# Patient Record
Sex: Female | Born: 1980 | Race: Black or African American | Hispanic: No
Health system: Southern US, Community
[De-identification: ages and names within clinical notes are randomized; demographics above are authoritative.]

## PROBLEM LIST (undated history)

## (undated) DIAGNOSIS — K5090 Crohn's disease, unspecified, without complications: Secondary | ICD-10-CM

## (undated) DIAGNOSIS — I471 Supraventricular tachycardia, unspecified: Secondary | ICD-10-CM

## (undated) DIAGNOSIS — R002 Palpitations: Secondary | ICD-10-CM

## (undated) HISTORY — PX: BOWEL RESECTION: SHX1257

## (undated) HISTORY — PX: APPENDECTOMY: SHX54

## (undated) HISTORY — DX: Supraventricular tachycardia, unspecified: I47.10

## (undated) HISTORY — PX: SMALL INTESTINE SURGERY: SHX150

## (undated) HISTORY — DX: Crohn's disease, unspecified, without complications: K50.90

## (undated) HISTORY — DX: Palpitations: R00.2

## (undated) HISTORY — DX: Supraventricular tachycardia: I47.1

---

## 2011-05-03 ENCOUNTER — Ambulatory Visit (INDEPENDENT_AMBULATORY_CARE_PROVIDER_SITE_OTHER): Payer: Self-pay | Admitting: Cardiology

## 2011-05-03 ENCOUNTER — Encounter: Payer: Self-pay | Admitting: Cardiology

## 2011-05-03 VITALS — BP 152/100 | HR 102 | Ht 65.0 in | Wt 187.0 lb

## 2011-05-03 DIAGNOSIS — I471 Supraventricular tachycardia: Secondary | ICD-10-CM

## 2011-05-03 DIAGNOSIS — R002 Palpitations: Secondary | ICD-10-CM

## 2011-05-03 DIAGNOSIS — I498 Other specified cardiac arrhythmias: Secondary | ICD-10-CM

## 2011-05-03 DIAGNOSIS — K5090 Crohn's disease, unspecified, without complications: Secondary | ICD-10-CM | POA: Insufficient documentation

## 2011-05-03 HISTORY — DX: Palpitations: R00.2

## 2011-05-03 NOTE — Assessment & Plan Note (Signed)
Status post ablation. No evidence of recurrence.

## 2011-05-03 NOTE — Assessment & Plan Note (Signed)
Patient is describing palpitations today and was having these in the office during her electrocardiogram. Her electrocardiogram shows sinus tachycardia which is most likely related to the Mucinex D that she has been taking for the past 3 days. Check TSH. Discontinue Mucinex d.

## 2011-05-03 NOTE — Patient Instructions (Signed)
Your physician recommends that you schedule a follow-up appointment in: 4 WEEKS  Your physician recommends that you return for lab work in: TODAY   

## 2011-05-03 NOTE — Progress Notes (Signed)
HPI: 30 year old female with past medical history of SVT for evaluation of palpitations. Patient has had 3 previous SVT ablations. Her most recent was at [facility] 2005. She has had no recurrences since. She denies dyspnea, chest pain or syncope. Over the last 3 days she has had URI symptoms. She was taking Mucinex D. Over the past 3 days. Last evening she began feeling her heart palpitations. Her heart rate was elevated but this was unlike her previous episodes of SVT. She was seen at urgent care and we were asked to further evaluate. Note she was having her symptoms at the time of her electrocardiogram today.  No current outpatient prescriptions on file.    Not on File  Past Medical History  Diagnosis Date  . SVT (supraventricular tachycardia)     status post 3 ablations  . Crohn's disease     Past Surgical History  Procedure Date  . Small intestine surgery   . Appendectomy     History   Social History  . Marital Status: Single    Spouse Name: N/A    Number of Children: 0  . Years of Education: N/A   Occupational History  .      registered nurse   Social History Main Topics  . Smoking status: Never Smoker   . Smokeless tobacco: Not on file  . Alcohol Use: No  . Drug Use: No  . Sexually Active: Not on file   Other Topics Concern  . Not on file   Social History Narrative  . No narrative on file    Family History  Problem Relation Age of Onset  . Hypertension      ROS: no fevers or chills, productive cough, hemoptysis, dysphasia, odynophagia, melena, hematochezia, dysuria, hematuria, rash, seizure activity, orthopnea, PND, pedal edema, claudication. Remaining systems are negative.  Physical Exam: General:  Well developed/well nourished in NAD Skin warm/dry Patient not depressed No peripheral clubbing Back-normal HEENT-normal/normal eyelids Neck supple/normal carotid upstroke bilaterally; no bruits; no JVD; no thyromegaly chest - CTA/ normal  expansion CV - RRR/normal S1 and S2; no murmurs, rubs or gallops;  PMI nondisplaced Abdomen -NT/ND, no HSM, no mass, + bowel sounds, no bruit 2+ femoral pulses, no bruits Ext-no edema, chords, 2+ DP Neuro-grossly nonfocal  ECG sinus tachycardia at a rate of 102. RV conduction delay. Minor nonspecific T wave changes.

## 2011-05-04 NOTE — Progress Notes (Signed)
Addended by: Kem Parkinson on: 05/04/2011 10:05 AM   Modules accepted: Orders

## 2011-05-31 ENCOUNTER — Encounter: Payer: Self-pay | Admitting: *Deleted

## 2011-06-01 ENCOUNTER — Encounter: Payer: Self-pay | Admitting: Cardiology

## 2011-06-01 ENCOUNTER — Ambulatory Visit (INDEPENDENT_AMBULATORY_CARE_PROVIDER_SITE_OTHER): Payer: Managed Care, Other (non HMO) | Admitting: Cardiology

## 2011-06-01 DIAGNOSIS — I498 Other specified cardiac arrhythmias: Secondary | ICD-10-CM

## 2011-06-01 DIAGNOSIS — I471 Supraventricular tachycardia: Secondary | ICD-10-CM

## 2011-06-01 DIAGNOSIS — R002 Palpitations: Secondary | ICD-10-CM

## 2011-06-01 NOTE — Assessment & Plan Note (Signed)
No recurrent bouts. Will consider adding a beta blocker in the future if her symptoms recur and possible referral to electrophysiology.

## 2011-06-01 NOTE — Progress Notes (Signed)
HPI:30 year old female with past medical history of SVT I saw in Oct of 2012 for evaluation of palpitations. Patient has had 3 previous SVT ablations. Her most recent was at [facility] 2005. She has had no recurrences since. She was having tachycardia when I saw her previously and ECG with symptoms revealed sinus tach. She was taking Mucinex D. This was discontinued. TSH normal. Since I saw her previously, she has had rare brief palpitations but no symptoms like she had on the day that I saw her or any symptoms similar to her previous bouts of SVT. No dyspnea, chest pain or syncope.   No current outpatient prescriptions on file.     Past Medical History  Diagnosis Date  . SVT (supraventricular tachycardia)     status post 3 ablations  . Crohn's disease   . Palpitations 05/03/2011    Past Surgical History  Procedure Date  . Small intestine surgery   . Appendectomy     History   Social History  . Marital Status: Single    Spouse Name: N/A    Number of Children: 0  . Years of Education: N/A   Occupational History  .      registered nurse   Social History Main Topics  . Smoking status: Never Smoker   . Smokeless tobacco: Not on file  . Alcohol Use: No  . Drug Use: No  . Sexually Active: Not on file   Other Topics Concern  . Not on file   Social History Narrative  . No narrative on file    ROS: no fevers or chills, productive cough, hemoptysis, dysphasia, odynophagia, melena, hematochezia, dysuria, hematuria, rash, seizure activity, orthopnea, PND, pedal edema, claudication. Remaining systems are negative.  Physical Exam: Well-developed well-nourished in no acute distress.  Skin is warm and dry.  HEENT is normal.  Neck is supple. No thyromegaly.  Chest is clear to auscultation with normal expansion.  Cardiovascular exam is regular rate and rhythm.  Abdominal exam nontender or distended. No masses palpated. Extremities show no edema. neuro grossly  intact

## 2011-06-01 NOTE — Patient Instructions (Signed)
Your physician recommends that you schedule a follow-up appointment in: AS NEEDED  

## 2011-06-01 NOTE — Assessment & Plan Note (Signed)
Resolved no further workup.

## 2011-07-19 ENCOUNTER — Ambulatory Visit (INDEPENDENT_AMBULATORY_CARE_PROVIDER_SITE_OTHER): Payer: BC Managed Care – PPO

## 2011-07-19 DIAGNOSIS — J069 Acute upper respiratory infection, unspecified: Secondary | ICD-10-CM

## 2011-09-14 ENCOUNTER — Ambulatory Visit (INDEPENDENT_AMBULATORY_CARE_PROVIDER_SITE_OTHER): Payer: BC Managed Care – PPO | Admitting: Internal Medicine

## 2011-09-14 VITALS — BP 123/85 | HR 102 | Temp 99.4°F | Resp 18 | Ht 66.0 in | Wt 202.0 lb

## 2011-09-14 DIAGNOSIS — J029 Acute pharyngitis, unspecified: Secondary | ICD-10-CM

## 2011-09-14 DIAGNOSIS — R509 Fever, unspecified: Secondary | ICD-10-CM

## 2011-09-14 MED ORDER — AZITHROMYCIN 250 MG PO TABS: ORAL_TABLET | ORAL | Status: AC

## 2011-09-14 NOTE — Patient Instructions (Signed)
Fever  Fever is a higher-than-normal body temperature. A normal temperature varies with:  Age.   How it is measured (mouth, underarm, rectal, or ear).   Time of day.  In an adult, an oral temperature around 98.6 Fahrenheit (F) or 37 Celsius (C) is considered normal. A rise in temperature of about 1.8 F or 1 C is generally considered a fever (100.4 F or 38 C). In an infant age 31 days or less, a rectal temperature of 100.4 F (38 C) generally is regarded as fever. Fever is not a disease but can be a symptom of illness. CAUSES   Fever is most commonly caused by infection.   Some non-infectious problems can cause fever. For example:   Some arthritis problems.   Problems with the thyroid or adrenal glands.   Immune system problems.   Some kinds of cancer.   A reaction to certain medicines.   Occasionally, the source of a fever cannot be determined. This is sometimes called a "Fever of Unknown Origin" (FUO).   Some situations may lead to a temporary rise in body temperature that may go away on its own. Examples are:   Childbirth.   Surgery.   Some situations may cause a rise in body temperature but these are not considered "true fever". Examples are:   Intense exercise.   Dehydration.   Exposure to high outside or room temperatures.  SYMPTOMS   Feeling warm or hot.   Fatigue or feeling exhausted.   Aching all over.   Chills.   Shivering.   Sweats.  DIAGNOSIS  A fever can be suspected by your caregiver feeling that your skin is unusually warm. The fever is confirmed by taking a temperature with a thermometer. Temperatures can be taken different ways. Some methods are accurate and some are not: With adults, adolescents, and children:   An oral temperature is used most commonly.   An ear thermometer will only be accurate if it is positioned as recommended by the manufacturer.   Under the arm temperatures are not accurate and not recommended.   Most  electronic thermometers are fast and accurate.  Infants and Toddlers:  Rectal temperatures are recommended and most accurate.   Ear temperatures are not accurate in this age group and are not recommended.   Skin thermometers are not accurate.  RISKS AND COMPLICATIONS   During a fever, the body uses more oxygen, so a person with a fever may develop rapid breathing or shortness of breath. This can be dangerous especially in people with heart or lung disease.   The sweats that occur following a fever can cause dehydration.   High fever can cause seizures in infants and children.   Older persons can develop confusion during a fever.  TREATMENT   Medications may be used to control temperature.   Do not give aspirin to children with fevers. There is an association with Reye's syndrome. Reye's syndrome is a rare but potentially deadly disease.   If an infection is present and medications have been prescribed, take them as directed. Finish the full course of medications until they are gone.   Sponging or bathing with room-temperature water may help reduce body temperature. Do not use ice water or alcohol sponge baths.   Do not over-bundle children in blankets or heavy clothes.   Drinking adequate fluids during an illness with fever is important to prevent dehydration.  HOME CARE INSTRUCTIONS   For adults, rest and adequate fluid intake are important. Dress according   to how you feel, but do not over-bundle.   Drink enough water and/or fluids to keep your urine clear or pale yellow.   For infants over 3 months and children, giving medication as directed by your caregiver to control fever can help with comfort. The amount to be given is based on the child's weight. Do NOT give more than is recommended.  SEEK MEDICAL CARE IF:   You or your child are unable to keep fluids down.   Vomiting or diarrhea develops.   You develop a skin rash.   An oral temperature above 102 F (38.9 C)  develops, or a fever which persists for over 3 days.   You develop excessive weakness, dizziness, fainting or extreme thirst.   Fevers keep coming back after 3 days.  SEEK IMMEDIATE MEDICAL CARE IF:   Shortness of breath or trouble breathing develops   You pass out.   You feel you are making little or no urine.   New pain develops that was not there before (such as in the head, neck, chest, back, or abdomen).   You cannot hold down fluids.   Vomiting and diarrhea persist for more than a day or two.   You develop a stiff neck and/or your eyes become sensitive to light.   An unexplained temperature above 102 F (38.9 C) develops.  Document Released: 07/10/2005 Document Revised: 03/22/2011 Document Reviewed: 06/25/2008 [facility] Patient Information 2012 [place], [place].Pharyngitis, Viral and Bacterial Pharyngitis is soreness (inflammation) or infection of the pharynx. It is also called a sore throat. CAUSES  Most sore throats are caused by viruses and are part of a cold. However, some sore throats are caused by strep and other bacteria. Sore throats can also be caused by post nasal drip from draining sinuses, allergies and sometimes from sleeping with an open mouth. Infectious sore throats can be spread from person to person by coughing, sneezing and sharing cups or eating utensils. TREATMENT  Sore throats that are viral usually last 3-4 days. Viral illness will get better without medications (antibiotics). Strep throat and other bacterial infections will usually begin to get better about 24-48 hours after you begin to take antibiotics. HOME CARE INSTRUCTIONS   If the caregiver feels there is a bacterial infection or if there is a positive strep test, they will prescribe an antibiotic. The full course of antibiotics must be taken. If the full course of antibiotic is not taken, you or your child may become ill again. If you or your child has strep throat and do not finish all of the  medication, serious heart or kidney diseases may develop.   Drink enough water and fluids to keep your urine clear or pale yellow.   Only take over-the-counter or prescription medicines for pain, discomfort or fever as directed by your caregiver.   Get lots of rest.   Gargle with salt water ( tsp. of salt in a glass of water) as often as every 1-2 hours as you need for comfort.   Hard candies may soothe the throat if individual is not at risk for choking. Throat sprays or lozenges may also be used.  SEEK MEDICAL CARE IF:   Large, tender lumps in the neck develop.   A rash develops.   Green, yellow-brown or bloody sputum is coughed up.   Your baby is older than 3 months with a rectal temperature of 100.5 F (38.1 C) or higher for more than 1 day.  SEEK IMMEDIATE MEDICAL CARE IF:  A stiff neck develops.   You or your child are drooling or unable to swallow liquids.   You or your child are vomiting, unable to keep medications or liquids down.   You or your child has severe pain, unrelieved with recommended medications.   You or your child are having difficulty breathing (not due to stuffy nose).   You or your child are unable to fully open your mouth.   You or your child develop redness, swelling, or severe pain anywhere on the neck.   You have a fever.   Your baby is older than 3 months with a rectal temperature of 102 F (38.9 C) or higher.   Your baby is 70 months old or younger with a rectal temperature of 100.4 F (38 C) or higher.  MAKE SURE YOU:   Understand these instructions.   Will watch your condition.   Will get help right away if you are not doing well or get worse.  Document Released: 07/10/2005 Document Revised: 03/22/2011 Document Reviewed: 10/07/2007 [facility] Patient Information 2012 [place], [place].

## 2011-09-14 NOTE — Progress Notes (Signed)
  Subjective:    Patient ID: Belinda Harris, female    DOB: 08/29/1980, 31 y.o.   MRN: [id number]  HPI Fever and sore throat. No chest sx.   Review of Systems  On BCP    Objective:   Physical Exam  Constitutional: She is oriented to person, place, and time. She appears well-nourished. No distress.  HENT:  Mouth/Throat: Oropharyngeal exudate present.  Eyes: EOM are normal.  Neck: Normal range of motion.  Cardiovascular: Normal rate.   Pulmonary/Chest: Effort normal.  Neurological: She is alert and oriented to person, place, and time.          Assessment & Plan:   RST neg  ZPAK and supportive care

## 2012-10-17 ENCOUNTER — Emergency Department (HOSPITAL_BASED_OUTPATIENT_CLINIC_OR_DEPARTMENT_OTHER): Payer: BC Managed Care – PPO

## 2012-10-17 ENCOUNTER — Emergency Department (HOSPITAL_BASED_OUTPATIENT_CLINIC_OR_DEPARTMENT_OTHER)
Admission: EM | Admit: 2012-10-17 | Discharge: 2012-10-17 | Disposition: A | Discharge status: Home / Self Care | Payer: BC Managed Care – PPO | Attending: Emergency Medicine | Admitting: Emergency Medicine

## 2012-10-17 ENCOUNTER — Encounter (HOSPITAL_BASED_OUTPATIENT_CLINIC_OR_DEPARTMENT_OTHER): Payer: Self-pay | Admitting: *Deleted

## 2012-10-17 DIAGNOSIS — Z8719 Personal history of other diseases of the digestive system: Secondary | ICD-10-CM | POA: Insufficient documentation

## 2012-10-17 DIAGNOSIS — K5289 Other specified noninfective gastroenteritis and colitis: Secondary | ICD-10-CM | POA: Insufficient documentation

## 2012-10-17 DIAGNOSIS — R109 Unspecified abdominal pain: Secondary | ICD-10-CM | POA: Insufficient documentation

## 2012-10-17 DIAGNOSIS — R197 Diarrhea, unspecified: Secondary | ICD-10-CM | POA: Insufficient documentation

## 2012-10-17 DIAGNOSIS — K529 Noninfective gastroenteritis and colitis, unspecified: Secondary | ICD-10-CM

## 2012-10-17 DIAGNOSIS — R5381 Other malaise: Secondary | ICD-10-CM | POA: Insufficient documentation

## 2012-10-17 DIAGNOSIS — R34 Anuria and oliguria: Secondary | ICD-10-CM | POA: Insufficient documentation

## 2012-10-17 DIAGNOSIS — Z3202 Encounter for pregnancy test, result negative: Secondary | ICD-10-CM | POA: Insufficient documentation

## 2012-10-17 DIAGNOSIS — Z9049 Acquired absence of other specified parts of digestive tract: Secondary | ICD-10-CM | POA: Insufficient documentation

## 2012-10-17 DIAGNOSIS — IMO0001 Reserved for inherently not codable concepts without codable children: Secondary | ICD-10-CM | POA: Insufficient documentation

## 2012-10-17 HISTORY — DX: Crohn's disease, unspecified, without complications: K50.90

## 2012-10-17 LAB — COMPREHENSIVE METABOLIC PANEL
ALT: 10 U/L (ref 0–35)
Albumin: 3.7 g/dL (ref 3.5–5.2)
Alkaline Phosphatase: 56 U/L (ref 39–117)
Calcium: 9 mg/dL (ref 8.4–10.5)
GFR calc Af Amer: >90 mL/min (ref >90)
Potassium: 3.5 mEq/L (ref 3.5–5.1)
Sodium: 139 mEq/L (ref 135–145)
Total Protein: 7.8 g/dL (ref 6.0–8.3)

## 2012-10-17 LAB — CBC WITH DIFFERENTIAL/PLATELET
Basophils Absolute: 0 10*3/uL (ref 0.0–0.1)
Basophils Relative: 0 % (ref 0–1)
Eosinophils Absolute: 0.1 10*3/uL (ref 0.0–0.7)
Eosinophils Relative: 2 % (ref 0–5)
MCH: 31.2 pg (ref 26.0–34.0)
MCHC: 34.8 g/dL (ref 30.0–36.0)
MCV: 89.7 fL (ref 78.0–100.0)
Neutrophils Relative %: 46 % (ref 43–77)
Platelets: 289 10*3/uL (ref 150–400)
RBC: 4.58 MIL/uL (ref 3.87–5.11)
RDW: 11.9 % (ref 11.5–15.5)

## 2012-10-17 LAB — URINE MICROSCOPIC-ADD ON

## 2012-10-17 LAB — PREGNANCY, URINE: Preg Test, Ur: NEGATIVE

## 2012-10-17 LAB — URINALYSIS, ROUTINE W REFLEX MICROSCOPIC
Glucose, UA: NEGATIVE mg/dL
Specific Gravity, Urine: 1.031 — ABNORMAL HIGH (ref 1.005–1.030)

## 2012-10-17 MED ORDER — SODIUM CHLORIDE 0.9 % IV BOLUS (SEPSIS)
1000.0000 mL | Freq: Once | INTRAVENOUS | Status: AC
  Administered 2012-10-17: 1000 mL via INTRAVENOUS

## 2012-10-17 MED ORDER — IOHEXOL 300 MG/ML  SOLN
100.0000 mL | Freq: Once | INTRAMUSCULAR | Status: AC | PRN
  Administered 2012-10-17: 100 mL via INTRAVENOUS

## 2012-10-17 MED ORDER — PREDNISONE (PAK) 10 MG PO TABS: 20.0000 mg | ORAL_TABLET | Freq: Every day | ORAL | Status: AC

## 2012-10-17 MED ORDER — ONDANSETRON 8 MG PO TBDP: ORAL_TABLET | ORAL | Status: AC

## 2012-10-17 MED ORDER — ONDANSETRON HCL 4 MG/2ML IJ SOLN
4.0000 mg | Freq: Once | INTRAMUSCULAR | Status: AC
  Administered 2012-10-17: 4 mg via INTRAVENOUS
  Filled 2012-10-17: qty 2

## 2012-10-17 MED ORDER — IOHEXOL 300 MG/ML  SOLN
50.0000 mL | Freq: Once | INTRAMUSCULAR | Status: AC | PRN
  Administered 2012-10-17: 50 mL via ORAL

## 2012-10-17 NOTE — ED Provider Notes (Signed)
History     CSN: [id number]  Arrival date & time 10/17/12  0501   First MD Initiated Contact with Patient 10/17/12 [phone]      Chief Complaint  Patient presents with   Emesis   Diarrhea    (Consider location/radiation/quality/duration/timing/severity/associated sxs/prior treatment) HPI Comments: Patient presents with a five-day history of nausea vomiting and diarrhea. She's describes the emesis is nonbilious and nonbloody. Her diarrhea as watery and nonbloody. She states she is feeling a little bit better yesterday he was able to eat some soup but then the diarrhea started back again in the vomiting started back again last night. She denies a known fevers. She has some pain in her left abdomen. She states it's fairly constant it is worse with diarrhea episodes. She does have a history of Crohn's disease however she hasn't had a flareup in about 10 years. She doesn't remember what her flareups were like in the past. She does remember that she used to be on medication for Crohn's and would intermittently take steroids but she has not been on medications for about 10 years.  Patient is a 32 y.o. female presenting with vomiting and diarrhea.  Emesis Associated symptoms: abdominal pain, diarrhea and myalgias   Associated symptoms: no arthralgias, no chills and no headaches   Diarrhea Associated symptoms: abdominal pain, myalgias and vomiting   Associated symptoms: no arthralgias, no chills, no diaphoresis, no fever and no headaches     Past Medical History  Diagnosis Date   Crohn's disease     Past Surgical History  Procedure Laterality Date   Bowel resection      History reviewed. No pertinent family history.  History  Substance Use Topics   Smoking status: Never Smoker    Smokeless tobacco: Not on file   Alcohol Use: No    OB History   Grav Para Term Preterm Abortions TAB SAB Ect Mult Living                  Review of Systems  Constitutional: Positive for  fatigue. Negative for fever, chills and diaphoresis.  HENT: Negative for congestion, rhinorrhea and sneezing.   Eyes: Negative.   Respiratory: Negative for cough, chest tightness and shortness of breath.   Cardiovascular: Negative for chest pain and leg swelling.  Gastrointestinal: Positive for nausea, vomiting, abdominal pain and diarrhea. Negative for blood in stool.  Genitourinary: Positive for decreased urine volume. Negative for frequency, hematuria, flank pain and difficulty urinating.  Musculoskeletal: Positive for myalgias. Negative for back pain and arthralgias.  Skin: Negative for rash.  Neurological: Negative for dizziness, speech difficulty, weakness, numbness and headaches.    Allergies  Review of patient's allergies indicates no known allergies.  Home Medications   Current Outpatient Rx  Name  Route  Sig  Dispense  Refill   etonogestrel-ethinyl estradiol (NUVARING) 0.12-0.015 MG/24HR vaginal ring   Vaginal   Place 1 each vaginally every 28 (twenty-eight) days. Insert vaginally and leave in place for 3 consecutive weeks, then remove for 1 week.          ondansetron (ZOFRAN ODT) 8 MG disintegrating tablet      8mg  ODT q4 hours prn nausea   12 tablet   0    predniSONE (STERAPRED UNI-PAK) 10 MG tablet   Oral   Take 2 tablets (20 mg total) by mouth daily. Take 3 tablets by mouth daily for 2 days, then 2 tablets by mouth daily for 2 days, then 1 tablet by mouth  for 2 days   12 tablet   0     BP 139/91   Pulse 88   Temp(Src) 99 F (37.2 C) (Oral)   Resp 20   Ht 5\' 5"  (1.651 m)   Wt 210 lb (95.255 kg)   BMI 34.95 kg/m2   SpO2 98%  Physical Exam  Constitutional: She is oriented to person, place, and time. She appears well-developed and well-nourished.  HENT:  Head: Normocephalic and atraumatic.  Eyes: Pupils are equal, round, and reactive to light.  Neck: Normal range of motion. Neck supple.  Cardiovascular: Normal rate, regular rhythm and normal heart sounds.    Pulmonary/Chest: Effort normal and breath sounds normal. No respiratory distress. She has no wheezes. She has no rales. She exhibits no tenderness.  Abdominal: Soft. Bowel sounds are normal. There is tenderness (mild tenderness to left mid and lower abdomen). There is no rebound and no guarding.  Musculoskeletal: Normal range of motion. She exhibits no edema.  Lymphadenopathy:    She has no cervical adenopathy.  Neurological: She is alert and oriented to person, place, and time.  Skin: Skin is warm and dry. No rash noted.  Psychiatric: She has a normal mood and affect.    ED Course  Procedures (including critical care time)  Results for orders placed during the hospital encounter of 10/17/12  URINALYSIS, ROUTINE W REFLEX MICROSCOPIC      Result Value Range   Color, Urine AMBER (*) YELLOW   APPearance CLOUDY (*) CLEAR   Specific Gravity, Urine 1.031 (*) 1.005 - 1.030   pH 6.0  5.0 - 8.0   Glucose, UA NEGATIVE  NEGATIVE mg/dL   Hgb urine dipstick LARGE (*) NEGATIVE   Bilirubin Urine SMALL (*) NEGATIVE   Ketones, ur 15 (*) NEGATIVE mg/dL   Protein, ur 30 (*) NEGATIVE mg/dL   Urobilinogen, UA 1.0  0.0 - 1.0 mg/dL   Nitrite NEGATIVE  NEGATIVE   Leukocytes, UA SMALL (*) NEGATIVE  PREGNANCY, URINE      Result Value Range   Preg Test, Ur NEGATIVE  NEGATIVE  CBC WITH DIFFERENTIAL      Result Value Range   WBC 5.7  4.0 - 10.5 K/uL   RBC 4.58  3.87 - 5.11 MIL/uL   Hemoglobin 14.3  12.0 - 15.0 g/dL   HCT 96.0  45.4 - 09.8 %   MCV 89.7  78.0 - 100.0 fL   MCH 31.2  26.0 - 34.0 pg   MCHC 34.8  30.0 - 36.0 g/dL   RDW 11.9  14.7 - 82.9 %   Platelets 289  150 - 400 K/uL   Neutrophils Relative 46  43 - 77 %   Neutro Abs 2.6  1.7 - 7.7 K/uL   Lymphocytes Relative 43  12 - 46 %   Lymphs Abs 2.5  0.7 - 4.0 K/uL   Monocytes Relative 9  3 - 12 %   Monocytes Absolute 0.5  0.1 - 1.0 K/uL   Eosinophils Relative 2  0 - 5 %   Eosinophils Absolute 0.1  0.0 - 0.7 K/uL   Basophils Relative 0  0 -  1 %   Basophils Absolute 0.0  0.0 - 0.1 K/uL  COMPREHENSIVE METABOLIC PANEL      Result Value Range   Sodium 139  135 - 145 mEq/L   Potassium 3.5  3.5 - 5.1 mEq/L   Chloride 101  96 - 112 mEq/L   CO2 27  19 - 32 mEq/L  Glucose, Bld 110 (*) 70 - 99 mg/dL   BUN 10  6 - 23 mg/dL   Creatinine, Ser 1.61  0.50 - 1.10 mg/dL   Calcium 9.0  8.4 - 09.6 mg/dL   Total Protein 7.8  6.0 - 8.3 g/dL   Albumin 3.7  3.5 - 5.2 g/dL   AST 16  0 - 37 U/L   ALT 10  0 - 35 U/L   Alkaline Phosphatase 56  39 - 117 U/L   Total Bilirubin 0.5  0.3 - 1.2 mg/dL   GFR calc non Af Amer >90  >90 mL/min   GFR calc Af Amer >90  >90 mL/min  URINE MICROSCOPIC-ADD ON      Result Value Range   Squamous Epithelial / LPF MANY (*) RARE   WBC, UA 7-10  <3 WBC/hpf   RBC / HPF 21-50  <3 RBC/hpf   Bacteria, UA MANY (*) RARE   Urine-Other MUCOUS PRESENT     Ct Abdomen Pelvis W Contrast  10/17/2012  *RADIOLOGY REPORT*  Clinical Data: Crohn's disease.  Abdominal pain.  Nausea. Vomiting.  History of prior bowel resection.  CT ABDOMEN AND PELVIS WITH CONTRAST  Technique:  Multidetector CT imaging of the abdomen and pelvis was performed following the standard protocol during bolus administration of intravenous contrast.  Contrast: 50mL OMNIPAQUE IOHEXOL 300 MG/ML  SOLN, OMNIPAQUE IOHEXOL 300 MG/ML  SOLN  Comparison: None.  Findings: Lung Bases: Pectus excavatum is present.  Dependent atelectasis in the lungs.  Visualized heart grossly normal. On sagittal reconstructed images, there is eventration of the anterior hemidiaphragms with fat from the abdomen extending into the inferior chest.  Liver:  Normal.  Spleen:  Normal.  Gallbladder:  Partially contracted.  No calcified stones.  Common bile duct:  Normal.  Pancreas:  Normal.  Adrenal glands:  Normal.  Kidneys:  Normal enhancement.  No calculi.  No hydronephrosis.  The ureters appear within normal limits.  Stomach:  Normal.  Small bowel:  No obstruction.  No inflammatory changes of  small bowel.  Normal opacification.  Small mesenteric lymph nodes are present.  Colon:   Surgical clips are present in the cecum compatible with prior appendectomy.  No appendix is identified.  There is no colonic mural inflammation or obstruction.  An anastomotic staple line is present in the sigmoid colon.  Pelvic Genitourinary:  Tampon is present in the vagina.  Anteverted uterus appears normal.  Physiologic appearance of the adnexa.  The urinary bladder normal.  No free fluid.  Bones:  No aggressive osseous lesions.  Sacroiliac joints appear within normal limits.  Vasculature: Normal.  IMPRESSION:  1.  No acute abnormality. 2.  Postoperative changes in the cecum and sigmoid colon.   Original Report Authenticated By: Andreas Newport, M.D.        1. Colitis       MDM  Patient with a five-day history of nausea vomiting and diarrhea. She has some crampy abdominal pain. She does have a history of Crohn's in her mother says that these are the type symptoms that she had with her typical Crohn's flareups when she was younger. Her CT was unremarkable. She's feeling better now with no active vomiting. She did not have an episode of diarrhea in ED to collect for analysis. I will go ahead and discharge her with prescriptions for Zofran and since her symptoms are similar to what her prior Crohn's flareups where local and treat her with a course of prednisone. I advised her  to followup with her primary care physician within the next 2 days for recheck or return here as needed for any worsening symptoms.        Rolan Bucco, MD 10/17/12 [phone]

## 2012-10-17 NOTE — ED Notes (Signed)
Patient transported to CT °

## 2012-10-17 NOTE — ED Notes (Signed)
C/o n/v/d since Sunday, denies fever

## 2012-10-17 NOTE — ED Notes (Signed)
MD at bedside. °

## 2012-10-18 LAB — URINE CULTURE

## 2013-05-29 ENCOUNTER — Other Ambulatory Visit: Payer: Self-pay

## 2018-02-06 ENCOUNTER — Ambulatory Visit: Payer: Self-pay | Admitting: Podiatry

## 2019-01-28 ENCOUNTER — Other Ambulatory Visit: Payer: Self-pay | Admitting: *Deleted

## 2019-01-28 DIAGNOSIS — Z20822 Contact with and (suspected) exposure to covid-19: Secondary | ICD-10-CM

## 2019-02-01 NOTE — Addendum Note (Signed)
Addended by: Brigitte Pulse on: 02/01/2019 10:04 AM   Modules accepted: Orders

## 2020-08-15 ENCOUNTER — Other Ambulatory Visit: Payer: Self-pay

## 2021-05-28 ENCOUNTER — Other Ambulatory Visit: Payer: Self-pay

## 2021-05-28 ENCOUNTER — Other Ambulatory Visit: Payer: Self-pay | Admitting: Family Medicine

## 2021-05-28 ENCOUNTER — Ambulatory Visit
Admission: RE | Admit: 2021-05-28 | Discharge: 2021-05-28 | Disposition: A | Discharge status: Home / Self Care | Payer: PRIVATE HEALTH INSURANCE | Source: Ambulatory Visit | Attending: Family Medicine | Admitting: Family Medicine

## 2021-05-28 ENCOUNTER — Ambulatory Visit
Admission: RE | Admit: 2021-05-28 | Discharge: 2021-05-28 | Disposition: A | Discharge status: Home / Self Care | Payer: Self-pay | Source: Ambulatory Visit | Attending: Family Medicine | Admitting: Family Medicine

## 2021-05-28 DIAGNOSIS — Z1231 Encounter for screening mammogram for malignant neoplasm of breast: Secondary | ICD-10-CM

## 2021-06-02 ENCOUNTER — Other Ambulatory Visit: Payer: Self-pay | Admitting: Obstetrics and Gynecology

## 2021-06-02 DIAGNOSIS — R928 Other abnormal and inconclusive findings on diagnostic imaging of breast: Secondary | ICD-10-CM

## 2021-06-28 ENCOUNTER — Ambulatory Visit
Admission: RE | Admit: 2021-06-28 | Discharge: 2021-06-28 | Disposition: A | Discharge status: Home / Self Care | Payer: PRIVATE HEALTH INSURANCE | Source: Ambulatory Visit | Attending: Obstetrics and Gynecology | Admitting: Obstetrics and Gynecology

## 2021-06-28 ENCOUNTER — Ambulatory Visit
Admission: RE | Admit: 2021-06-28 | Discharge: 2021-06-28 | Disposition: A | Discharge status: Home / Self Care | Payer: BC Managed Care – PPO | Source: Ambulatory Visit | Attending: Obstetrics and Gynecology | Admitting: Obstetrics and Gynecology

## 2021-06-28 ENCOUNTER — Other Ambulatory Visit: Payer: Self-pay | Admitting: Obstetrics and Gynecology

## 2021-06-28 ENCOUNTER — Other Ambulatory Visit: Payer: Self-pay

## 2021-06-28 DIAGNOSIS — R928 Other abnormal and inconclusive findings on diagnostic imaging of breast: Secondary | ICD-10-CM

## 2021-06-28 DIAGNOSIS — R921 Mammographic calcification found on diagnostic imaging of breast: Secondary | ICD-10-CM

## 2021-06-28 DIAGNOSIS — N6320 Unspecified lump in the left breast, unspecified quadrant: Secondary | ICD-10-CM

## 2021-12-28 ENCOUNTER — Ambulatory Visit: Payer: BC Managed Care – PPO

## 2021-12-28 ENCOUNTER — Ambulatory Visit
Admission: RE | Admit: 2021-12-28 | Discharge: 2021-12-28 | Disposition: A | Discharge status: Home / Self Care | Payer: BC Managed Care – PPO | Source: Ambulatory Visit | Attending: Obstetrics and Gynecology | Admitting: Obstetrics and Gynecology

## 2021-12-28 ENCOUNTER — Other Ambulatory Visit: Payer: Self-pay | Admitting: Obstetrics and Gynecology

## 2021-12-28 DIAGNOSIS — N6320 Unspecified lump in the left breast, unspecified quadrant: Secondary | ICD-10-CM

## 2021-12-28 DIAGNOSIS — R921 Mammographic calcification found on diagnostic imaging of breast: Secondary | ICD-10-CM

## 2022-05-30 ENCOUNTER — Ambulatory Visit
Admission: RE | Admit: 2022-05-30 | Discharge: 2022-05-30 | Disposition: A | Discharge status: Home / Self Care | Payer: BC Managed Care – PPO | Source: Ambulatory Visit | Attending: Obstetrics and Gynecology | Admitting: Obstetrics and Gynecology

## 2022-05-30 ENCOUNTER — Other Ambulatory Visit: Payer: Self-pay | Admitting: Obstetrics and Gynecology

## 2022-05-30 DIAGNOSIS — R921 Mammographic calcification found on diagnostic imaging of breast: Secondary | ICD-10-CM

## 2022-11-08 IMAGING — MG MM DIGITAL DIAGNOSTIC UNILAT*L* W/ TOMO W/ CAD
6 series · 6 of 14 positions shown · non-contrast
Comparison: Previous exam(s).

CLINICAL DATA: First six-month follow-up for probably benign LEFT
breast calcifications.

EXAM:
DIGITAL DIAGNOSTIC UNILATERAL LEFT MAMMOGRAM WITH TOMOSYNTHESIS AND
CAD
TECHNIQUE: Left digital diagnostic mammography and breast tomosynthesis was
performed. The images were evaluated with computer-aided detection.

[L ML]
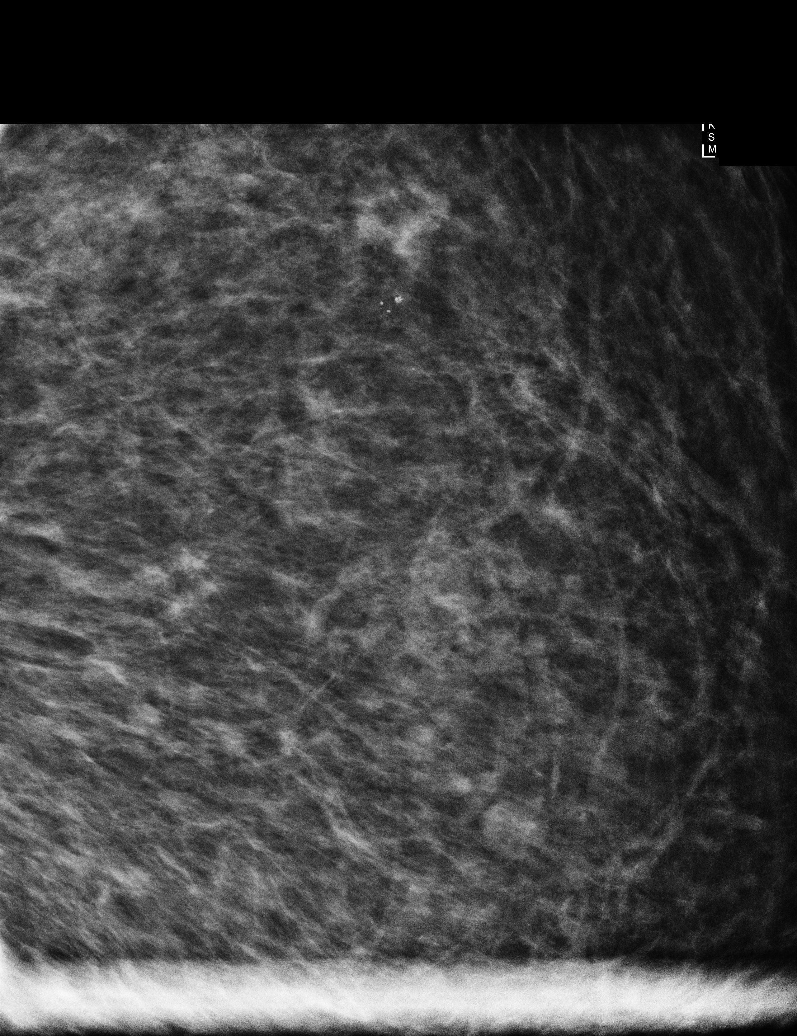

[L CC]
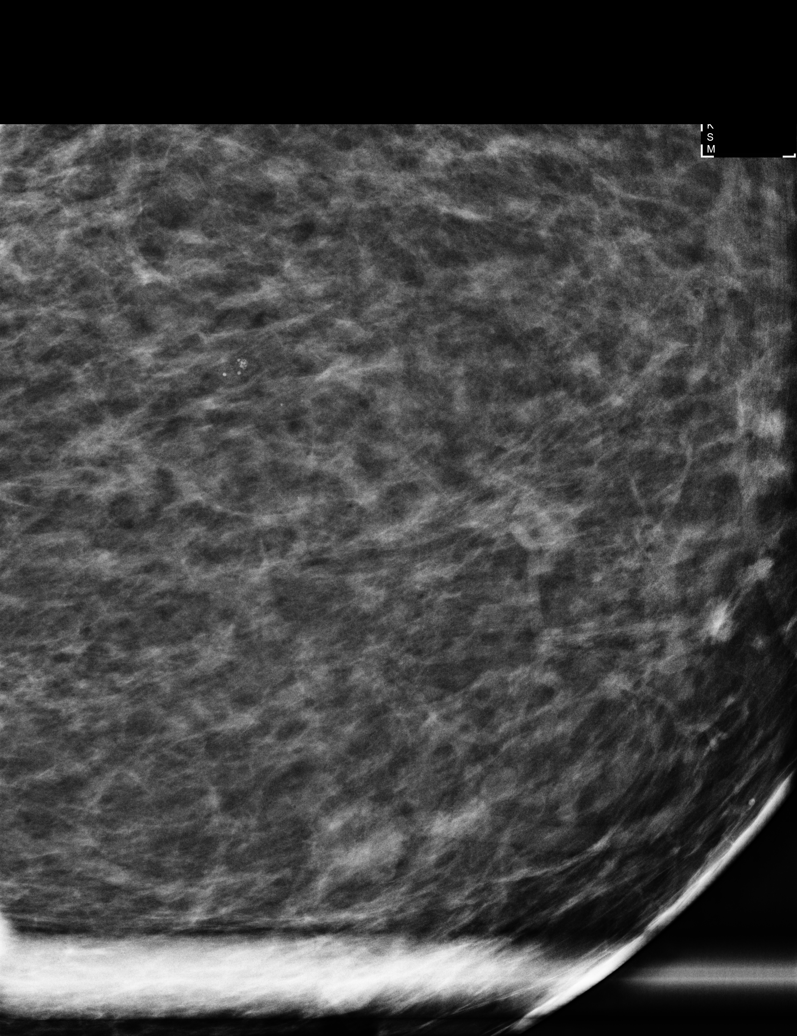

[L MLO synth-2D]
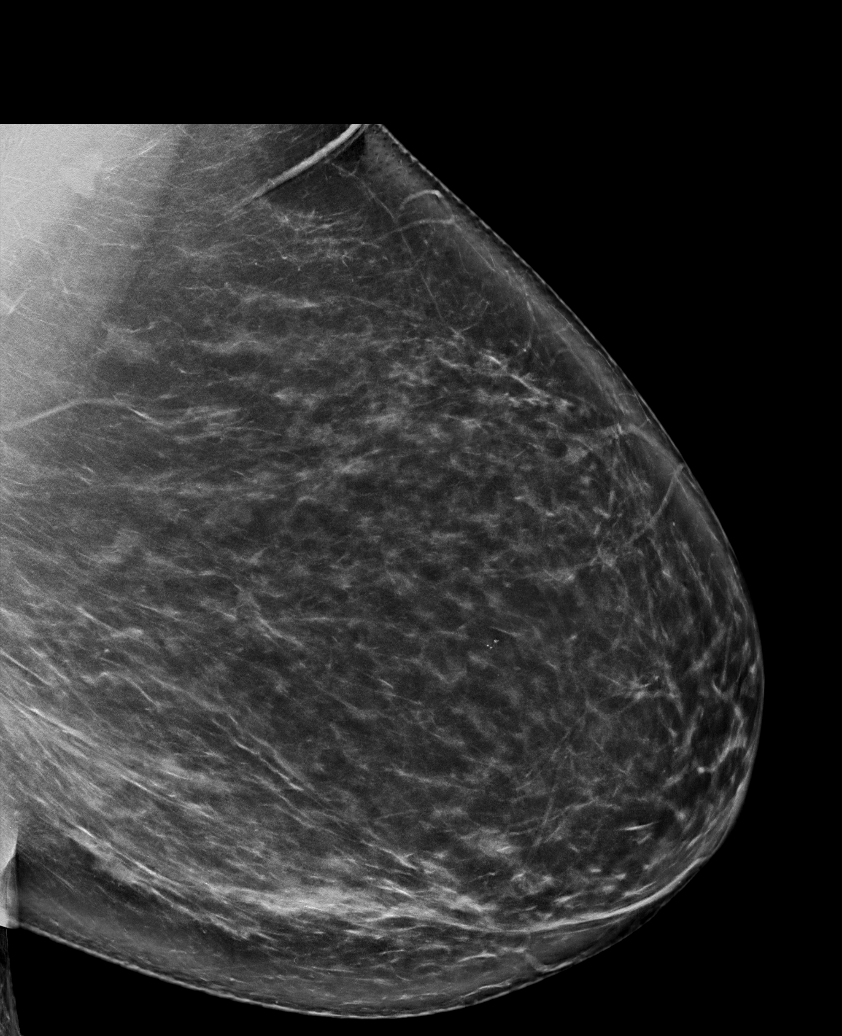

[L CC synth-2D]
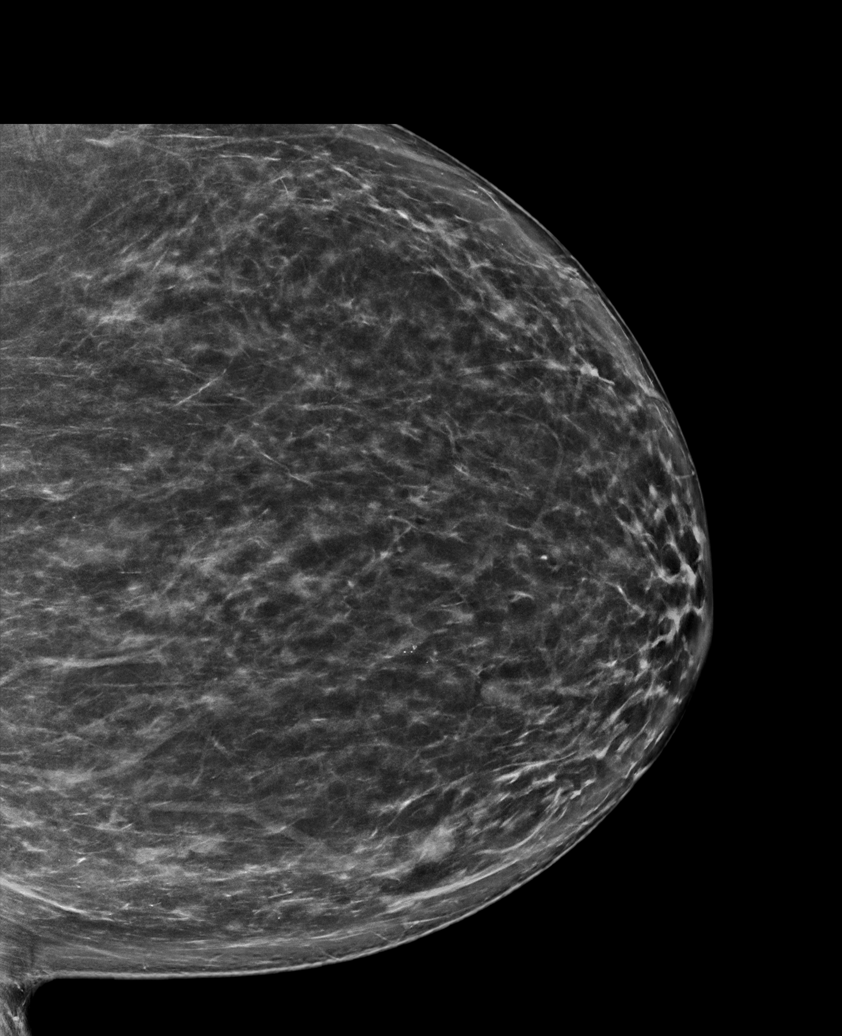

[L MLO tomo · tomo slice 50/99.0]
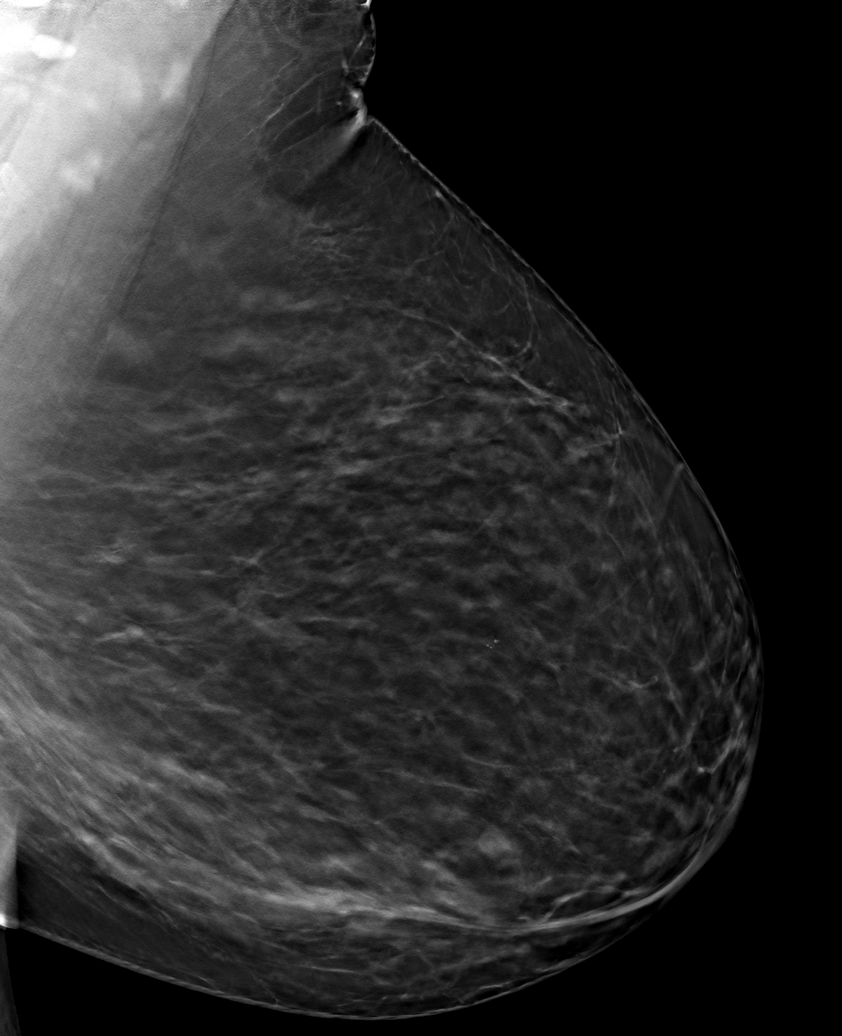

[L CC tomo · tomo slice 45/88.0]
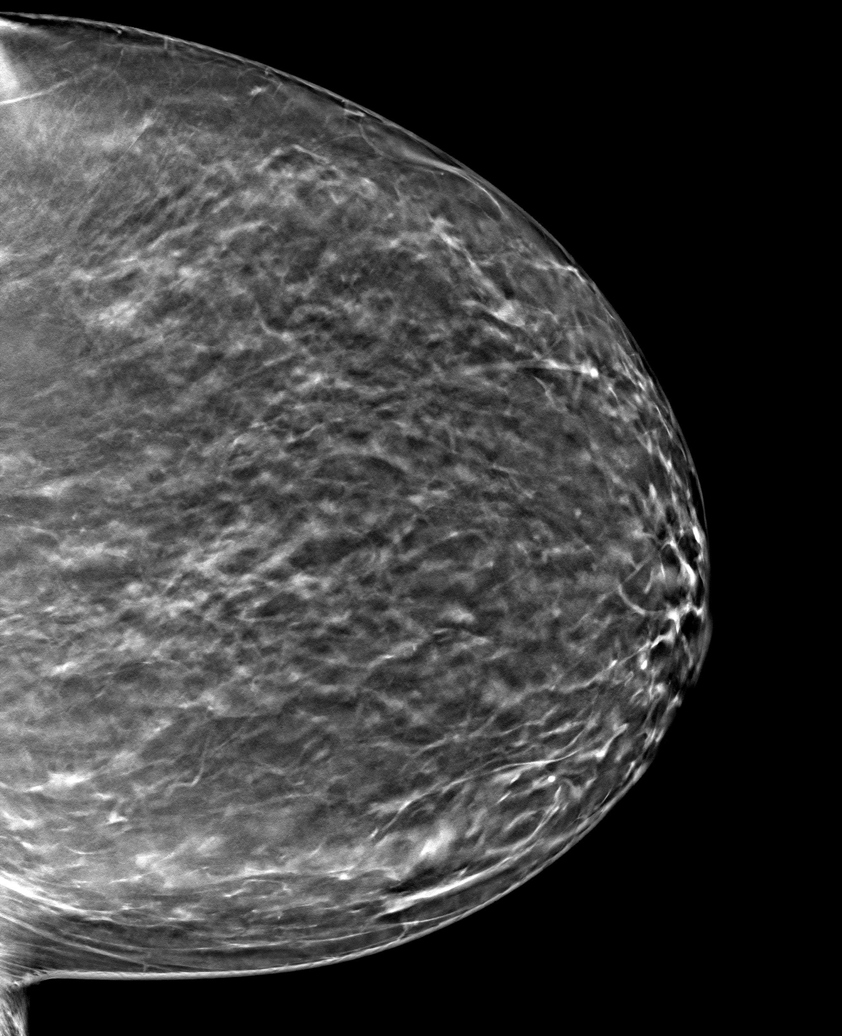

[6 of 14 positions shown; findings below may reference images not displayed]

ACR Breast Density Category c: The breast tissue is heterogeneously
dense, which may obscure small masses.
FINDINGS: Magnified views are performed of calcifications in the UPPER INNER
QUADRANT of the LEFT breast. Calcifications in this region are
round, some with lucent center. Calcifications have no suspicious
morphology or distribution. No new or suspicious findings in the
LEFT breast.
IMPRESSION: Stable, probably benign breast calcifications.

RECOMMENDATION:
Recommend bilateral diagnostic mammogram in 6 months.

I have discussed the findings and recommendations with the patient.
If applicable, a reminder letter will be sent to the patient
regarding the next appointment.

BI-RADS CATEGORY  3: Probably benign.

## 2023-05-29 ENCOUNTER — Other Ambulatory Visit: Payer: Self-pay | Admitting: Obstetrics and Gynecology

## 2023-05-29 DIAGNOSIS — R921 Mammographic calcification found on diagnostic imaging of breast: Secondary | ICD-10-CM

## 2023-05-29 DIAGNOSIS — N63 Unspecified lump in unspecified breast: Secondary | ICD-10-CM

## 2023-06-18 ENCOUNTER — Ambulatory Visit
Admission: RE | Admit: 2023-06-18 | Discharge: 2023-06-18 | Disposition: A | Discharge status: Home / Self Care | Payer: BC Managed Care – PPO | Source: Ambulatory Visit | Attending: Obstetrics and Gynecology | Admitting: Obstetrics and Gynecology

## 2023-06-18 ENCOUNTER — Other Ambulatory Visit: Payer: Self-pay | Admitting: Obstetrics and Gynecology

## 2023-06-18 DIAGNOSIS — N63 Unspecified lump in unspecified breast: Secondary | ICD-10-CM

## 2023-06-18 DIAGNOSIS — R921 Mammographic calcification found on diagnostic imaging of breast: Secondary | ICD-10-CM

## 2023-06-20 ENCOUNTER — Ambulatory Visit
Admission: RE | Admit: 2023-06-20 | Discharge: 2023-06-20 | Disposition: A | Discharge status: Home / Self Care | Payer: BC Managed Care – PPO | Source: Ambulatory Visit | Attending: Obstetrics and Gynecology | Admitting: Obstetrics and Gynecology

## 2023-06-20 ENCOUNTER — Inpatient Hospital Stay
Admission: RE | Admit: 2023-06-20 | Discharge: 2023-06-20 | Discharge status: Home / Self Care | Payer: BC Managed Care – PPO | Source: Ambulatory Visit | Attending: Obstetrics and Gynecology | Admitting: Obstetrics and Gynecology

## 2023-06-20 DIAGNOSIS — N63 Unspecified lump in unspecified breast: Secondary | ICD-10-CM

## 2023-06-20 HISTORY — PX: BREAST BIOPSY: SHX20

## 2023-06-22 LAB — SURGICAL PATHOLOGY

## 2024-01-06 ENCOUNTER — Emergency Department (HOSPITAL_BASED_OUTPATIENT_CLINIC_OR_DEPARTMENT_OTHER)

## 2024-01-06 ENCOUNTER — Emergency Department (HOSPITAL_BASED_OUTPATIENT_CLINIC_OR_DEPARTMENT_OTHER)
Admission: EM | Admit: 2024-01-06 | Discharge: 2024-01-06 | Disposition: A | Discharge status: Home / Self Care | Attending: Emergency Medicine | Admitting: Emergency Medicine

## 2024-01-06 ENCOUNTER — Encounter (HOSPITAL_BASED_OUTPATIENT_CLINIC_OR_DEPARTMENT_OTHER): Payer: Self-pay | Admitting: Emergency Medicine

## 2024-01-06 ENCOUNTER — Other Ambulatory Visit: Payer: Self-pay

## 2024-01-06 DIAGNOSIS — M79672 Pain in left foot: Secondary | ICD-10-CM | POA: Insufficient documentation

## 2024-01-06 DIAGNOSIS — W208XXA Other cause of strike by thrown, projected or falling object, initial encounter: Secondary | ICD-10-CM | POA: Insufficient documentation

## 2024-01-06 MED ORDER — TETANUS-DIPHTH-ACELL PERTUSSIS 5-2.5-18.5 LF-MCG/0.5 IM SUSY: 0.5000 mL | PREFILLED_SYRINGE | Freq: Once | INTRAMUSCULAR | Status: DC

## 2024-01-06 NOTE — Discharge Instructions (Signed)
 You were seen in the emergency department today for concerns of foot pain.  Your x-ray was negative for any signs of fracture, dislocation, or other injury.  I suspect you likely have pain from the umbrella that struck against your foot.  Please use Tylenol and ibuprofen for pain.  You may use ice to help reduce swelling for the next 24 hours.  For any concerns of new or worsening symptoms return to the emergency department.

## 2024-01-06 NOTE — ED Provider Notes (Signed)
 [place] EMERGENCY DEPARTMENT AT [facility] [place] Provider Note   CSN: [id number] Arrival date & time: 01/06/24  4098     Patient presents with: Foot Pain   Belinda Harris is a 43 y.o. female.  Patient with past history significant for SVT and Crohn's disease presents the emergency department concerns of foot pain.  She reports that she had a patio umbrella fall on her left foot.  Noticing pain to the left big toe.  Denies any other injuries.  Tolerating weightbearing.  No medications taken prior to arriving.   Foot Pain       Prior to Admission medications   Medication Sig Start Date End Date Taking? Authorizing Provider  etonogestrel-ethinyl estradiol (NUVARING) 0.12-0.015 MG/24HR vaginal ring Place 1 each vaginally every 28 (twenty-eight) days. Insert vaginally and leave in place for 3 consecutive weeks, then remove for 1 week.    [provider]  ondansetron  (ZOFRAN  ODT) 8 MG disintegrating tablet 8mg  ODT q4 hours prn nausea 10/17/12   Hershel Los, MD  predniSONE  (STERAPRED UNI-PAK) 10 MG tablet Take 2 tablets (20 mg total) by mouth daily. Take 3 tablets by mouth daily for 2 days, then 2 tablets by mouth daily for 2 days, then 1 tablet by mouth for 2 days 10/17/12   Hershel Los, MD    Allergies: Patient has no known allergies.    Review of Systems  Musculoskeletal:        Foot pain  All other systems reviewed and are negative.   Updated Vital Signs BP (!) 146/84 (BP Location: Left Arm)   Pulse 75   Temp 98.9 F (37.2 C) (Oral)   Resp 15   Ht 5' 5 (1.651 m)   Wt 77.1 kg   LMP 01/03/2024   SpO2 100%   BMI 28.29 kg/m   Physical Exam Vitals and nursing note reviewed.  Constitutional:      General: She is not in acute distress.    Appearance: She is well-developed.  HENT:     Head: Normocephalic and atraumatic.   Eyes:     Conjunctiva/sclera: Conjunctivae normal.    Cardiovascular:     Rate and Rhythm: Normal rate and regular  rhythm.     Heart sounds: No murmur heard. Pulmonary:     Effort: Pulmonary effort is normal. No respiratory distress.     Breath sounds: Normal breath sounds.  Abdominal:     Palpations: Abdomen is soft.     Tenderness: There is no abdominal tenderness.   Musculoskeletal:        General: Tenderness present. No swelling, deformity or signs of injury. Normal range of motion.     Cervical back: Neck supple.     Comments: TTP overlying the great toe of the left foot. No bruising, nail injury, or lacerations seen.   Skin:    General: Skin is warm and dry.     Capillary Refill: Capillary refill takes less than 2 seconds.   Neurological:     Mental Status: She is alert.   Psychiatric:        Mood and Affect: Mood normal.     (all labs ordered are listed, but only abnormal results are displayed) Labs Reviewed - No data to display  EKG: None  Radiology: DG Foot Complete Left Result Date: 01/06/2024 CLINICAL DATA:  Status post trauma to the left great toe. EXAM: LEFT FOOT - COMPLETE 3+ VIEW COMPARISON:  None Available. FINDINGS: There is no evidence of fracture or  dislocation. There is no evidence of arthropathy or other focal bone abnormality. Soft tissues are unremarkable. IMPRESSION: Negative. Electronically Signed   By: Virgle Grime M.D.   On: 01/06/2024 20:41     Procedures   Medications Ordered in the ED - No data to display                                  Medical Decision Making Amount and/or Complexity of Data Reviewed Radiology: ordered.   This patient presents to the ED for concern of foot pain.  Differential diagnosis includes midfoot fracture, nail avulsion, cellulitis, ankle sprain   Imaging Studies ordered:  I ordered imaging studies including x-ray of the left foot I independently visualized and interpreted imaging which showed negative for any acute injuries I agree with the radiologist interpretation   Problem List / ED Course:  Patient  presents the emergency department concerns of foot pain.  She reports that she had a umbrella fall onto her left foot at the great toe resulting in pain.  States she can ambulate but has some pain with ambulation.  Denies any altered sensation. On exam, patient does have point tenderness overlying the left great toe.  No nail avulsion present.  No significant erythema or swelling present.  Range of motion at baseline.  X-ray imaging ordered from triage for assessment of possible fracture or other injury.  No appreciable lacerations requiring repair at this time. X-ray imaging is negative for any acute findings.  Suspect this is likely a soft tissue injury and doubt more concerning findings given reassuring x-ray.  With patient tolerating weightbearing, again unlikely that this is an underlying fracture or more severe injury in place.  Advise use of Tylenol ibuprofen for pain control.  Encouraged use of ice to reduce swelling over the next day.  Discussed return precautions and need for possible outpatient follow-up with PCP if symptoms not improving.  Discharged home in stable condition.  Final diagnoses:  Left foot pain    ED Discharge Orders     None          Belinda Harris 01/06/24 2330    Dalene Duck, MD 01/08/24 [phone]

## 2024-01-06 NOTE — ED Triage Notes (Signed)
 Patient reports patio umbrella falling of left foot earlier today. Complaining of pain in left big toe. No lacerations. Able to move foot/toes.

## 2024-06-11 ENCOUNTER — Other Ambulatory Visit: Payer: Self-pay | Admitting: Obstetrics and Gynecology

## 2024-06-11 DIAGNOSIS — Z1231 Encounter for screening mammogram for malignant neoplasm of breast: Secondary | ICD-10-CM

## 2024-07-28 ENCOUNTER — Ambulatory Visit
